# Patient Record
Sex: Male | Born: 1950 | Race: White | Hispanic: No | Marital: Single | Smoking: Never smoker
Health system: Southern US, Community
[De-identification: ages and names within clinical notes are randomized; demographics above are authoritative.]

---

## 2015-01-30 ENCOUNTER — Emergency Department (HOSPITAL_BASED_OUTPATIENT_CLINIC_OR_DEPARTMENT_OTHER): Payer: No Typology Code available for payment source

## 2015-01-30 ENCOUNTER — Encounter (HOSPITAL_BASED_OUTPATIENT_CLINIC_OR_DEPARTMENT_OTHER): Payer: Self-pay | Admitting: *Deleted

## 2015-01-30 ENCOUNTER — Emergency Department (HOSPITAL_BASED_OUTPATIENT_CLINIC_OR_DEPARTMENT_OTHER)
Admission: EM | Admit: 2015-01-30 | Discharge: 2015-01-31 | Disposition: A | Discharge status: Home / Self Care | Payer: No Typology Code available for payment source | Attending: Emergency Medicine | Admitting: Emergency Medicine

## 2015-01-30 DIAGNOSIS — Y92410 Unspecified street and highway as the place of occurrence of the external cause: Secondary | ICD-10-CM | POA: Diagnosis not present

## 2015-01-30 DIAGNOSIS — S2232XA Fracture of one rib, left side, initial encounter for closed fracture: Secondary | ICD-10-CM | POA: Insufficient documentation

## 2015-01-30 DIAGNOSIS — S4992XA Unspecified injury of left shoulder and upper arm, initial encounter: Secondary | ICD-10-CM | POA: Insufficient documentation

## 2015-01-30 DIAGNOSIS — Z79899 Other long term (current) drug therapy: Secondary | ICD-10-CM | POA: Insufficient documentation

## 2015-01-30 DIAGNOSIS — Y9389 Activity, other specified: Secondary | ICD-10-CM | POA: Diagnosis not present

## 2015-01-30 DIAGNOSIS — S199XXA Unspecified injury of neck, initial encounter: Secondary | ICD-10-CM | POA: Diagnosis not present

## 2015-01-30 DIAGNOSIS — S301XXA Contusion of abdominal wall, initial encounter: Secondary | ICD-10-CM | POA: Diagnosis not present

## 2015-01-30 DIAGNOSIS — Y998 Other external cause status: Secondary | ICD-10-CM | POA: Diagnosis not present

## 2015-01-30 DIAGNOSIS — S299XXA Unspecified injury of thorax, initial encounter: Secondary | ICD-10-CM | POA: Diagnosis present

## 2015-01-30 LAB — BASIC METABOLIC PANEL
Anion gap: 8 (ref 5–15)
BUN: 14 mg/dL (ref 6–20)
CO2: 26 mmol/L (ref 22–32)
Calcium: 8.7 mg/dL — ABNORMAL LOW (ref 8.9–10.3)
Chloride: 105 mmol/L (ref 101–111)
Creatinine, Ser: 1.03 mg/dL (ref 0.61–1.24)
GFR calc Af Amer: >60 mL/min (ref >60)
GLUCOSE: 144 mg/dL — AB (ref 65–99)
POTASSIUM: 3.7 mmol/L (ref 3.5–5.1)
Sodium: 139 mmol/L (ref 135–145)

## 2015-01-30 LAB — CBC WITH DIFFERENTIAL/PLATELET
Basophils Absolute: 0 10*3/uL (ref 0.0–0.1)
Basophils Relative: 0 % (ref 0–1)
EOS PCT: 2 % (ref 0–5)
Eosinophils Absolute: 0.2 10*3/uL (ref 0.0–0.7)
HCT: 44.3 % (ref 39.0–52.0)
Hemoglobin: 15.2 g/dL (ref 13.0–17.0)
LYMPHS ABS: 3.4 10*3/uL (ref 0.7–4.0)
LYMPHS PCT: 28 % (ref 12–46)
MCH: 29.9 pg (ref 26.0–34.0)
MCHC: 34.3 g/dL (ref 30.0–36.0)
MCV: 87.2 fL (ref 78.0–100.0)
MONO ABS: 1.7 10*3/uL — AB (ref 0.1–1.0)
Monocytes Relative: 14 % — ABNORMAL HIGH (ref 3–12)
Neutro Abs: 6.7 10*3/uL (ref 1.7–7.7)
Neutrophils Relative %: 56 % (ref 43–77)
PLATELETS: 254 10*3/uL (ref 150–400)
RBC: 5.08 MIL/uL (ref 4.22–5.81)
RDW: 13.8 % (ref 11.5–15.5)
WBC: 12.1 10*3/uL — ABNORMAL HIGH (ref 4.0–10.5)

## 2015-01-30 MED ORDER — IOHEXOL 300 MG/ML  SOLN
100.0000 mL | Freq: Once | INTRAMUSCULAR | Status: AC | PRN
  Administered 2015-01-30: 100 mL via INTRAVENOUS
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
## 2015-01-31 MED ORDER — HYDROCODONE-ACETAMINOPHEN 5-325 MG PO TABS: 1.0000 | ORAL_TABLET | Freq: Four times a day (QID) | ORAL | Status: AC | PRN
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
